# Patient Record
Sex: Female | Born: 1940 | Race: White | Hispanic: No | Marital: Married | Smoking: Never smoker
Health system: Southern US, Community
[De-identification: ages and names within clinical notes are randomized; demographics above are authoritative.]

## PROBLEM LIST (undated history)

## (undated) DIAGNOSIS — I10 Essential (primary) hypertension: Secondary | ICD-10-CM

## (undated) HISTORY — PX: ABDOMINAL HYSTERECTOMY: SHX81

## (undated) HISTORY — PX: KNEE ARTHROSCOPY: SUR90

---

## 2014-08-13 ENCOUNTER — Ambulatory Visit: Payer: Self-pay | Admitting: Family Medicine

## 2014-08-23 ENCOUNTER — Ambulatory Visit: Payer: Self-pay | Admitting: Family Medicine

## 2015-09-08 ENCOUNTER — Other Ambulatory Visit: Payer: Self-pay | Admitting: Family Medicine

## 2015-09-08 DIAGNOSIS — Z1231 Encounter for screening mammogram for malignant neoplasm of breast: Secondary | ICD-10-CM

## 2016-01-13 IMAGING — US US BREAST*L* LIMITED INC AXILLA
1 series · 4 of 4 positions shown · non-contrast
Comparison: Priors

CLINICAL DATA: Patient recalled from screening for left breast
mass.

EXAM:
DIGITAL DIAGNOSTIC  LEFT MAMMOGRAM
ULTRASOUND LEFT BREAST

[Series 1: us breast*left* limited inc axilla · 0.08mm/px · 4 of 4 slices shown]
[im 1/4]
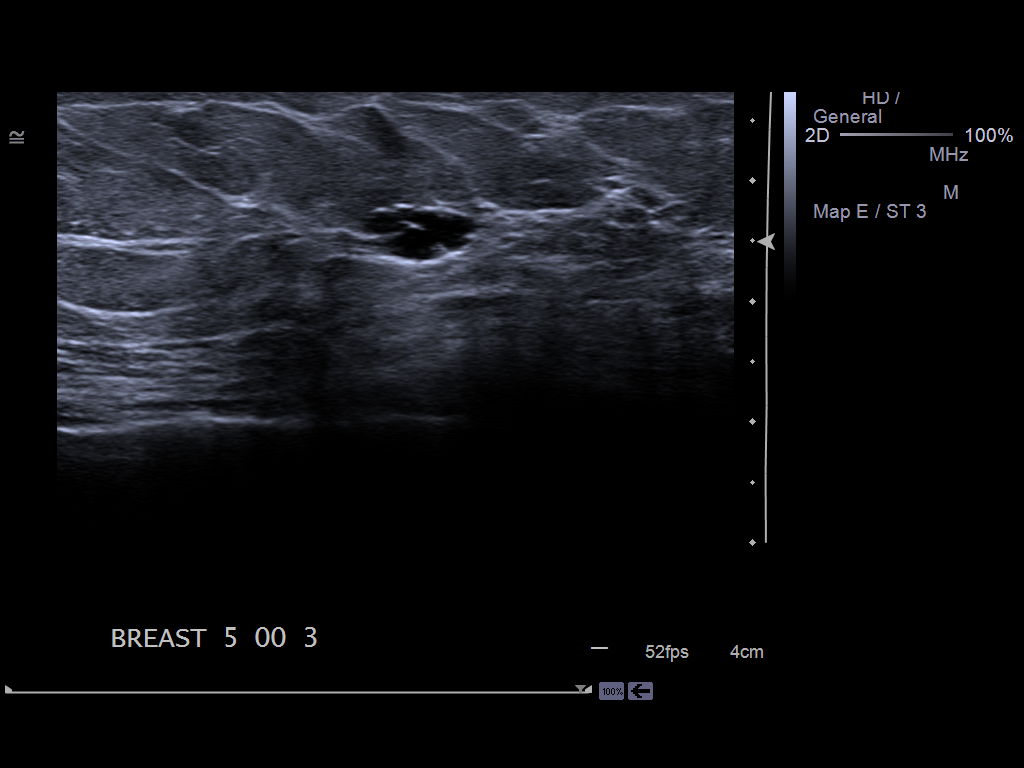
[im 2/4]
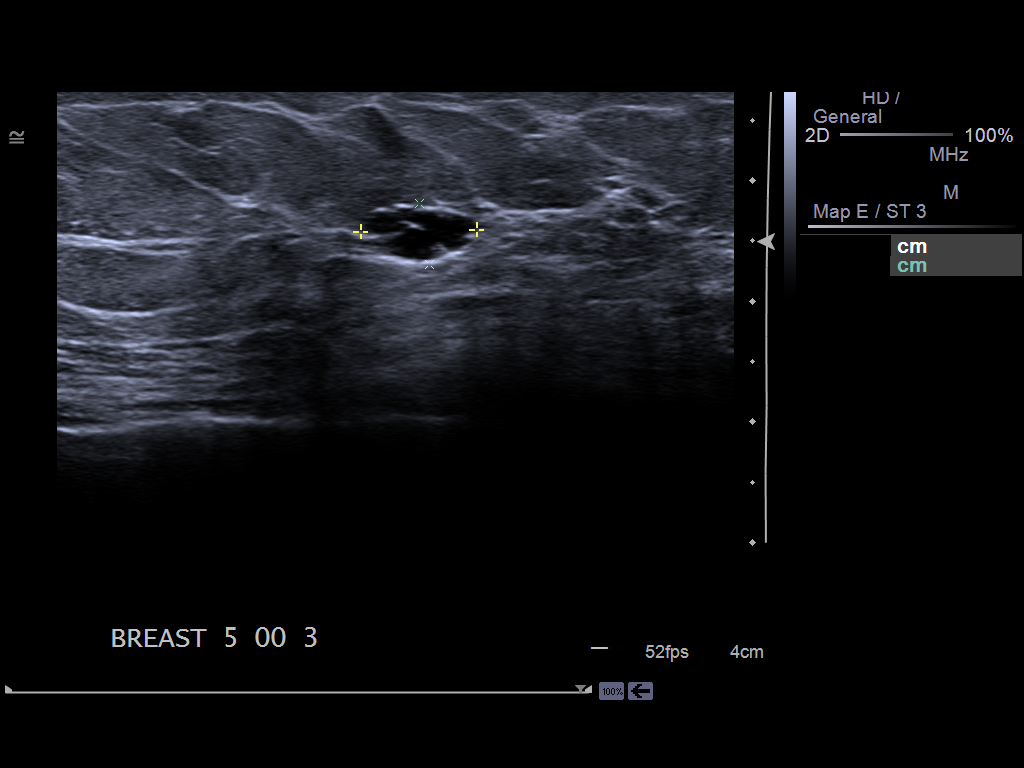
[im 3/4]
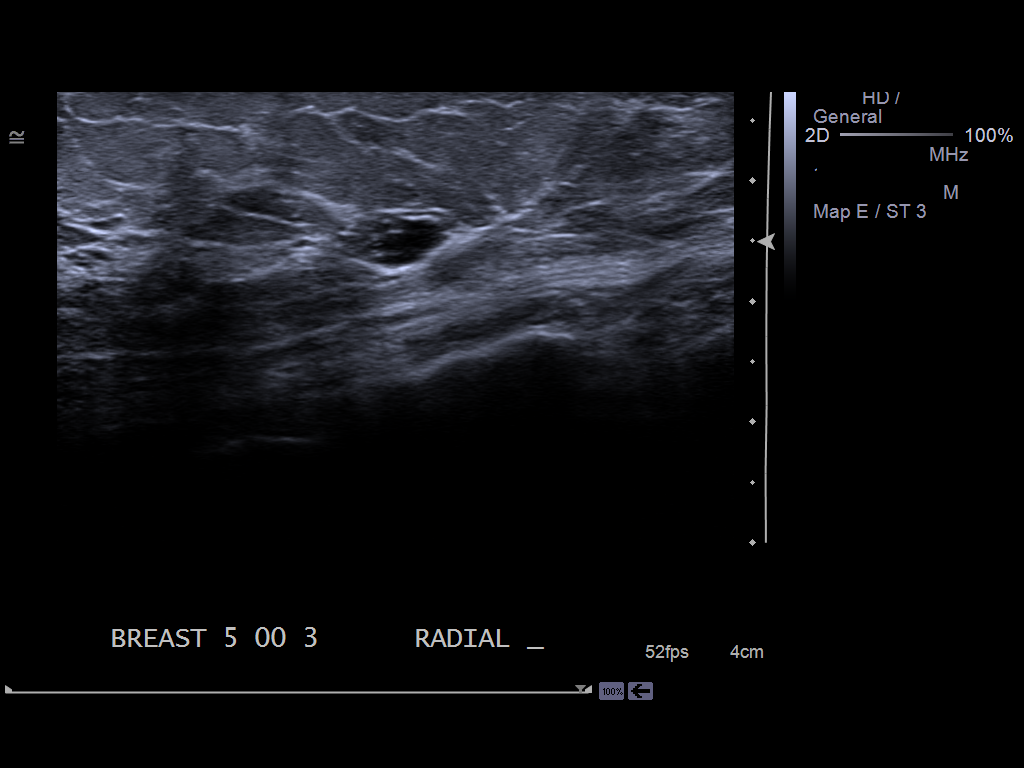
[im 4/4]
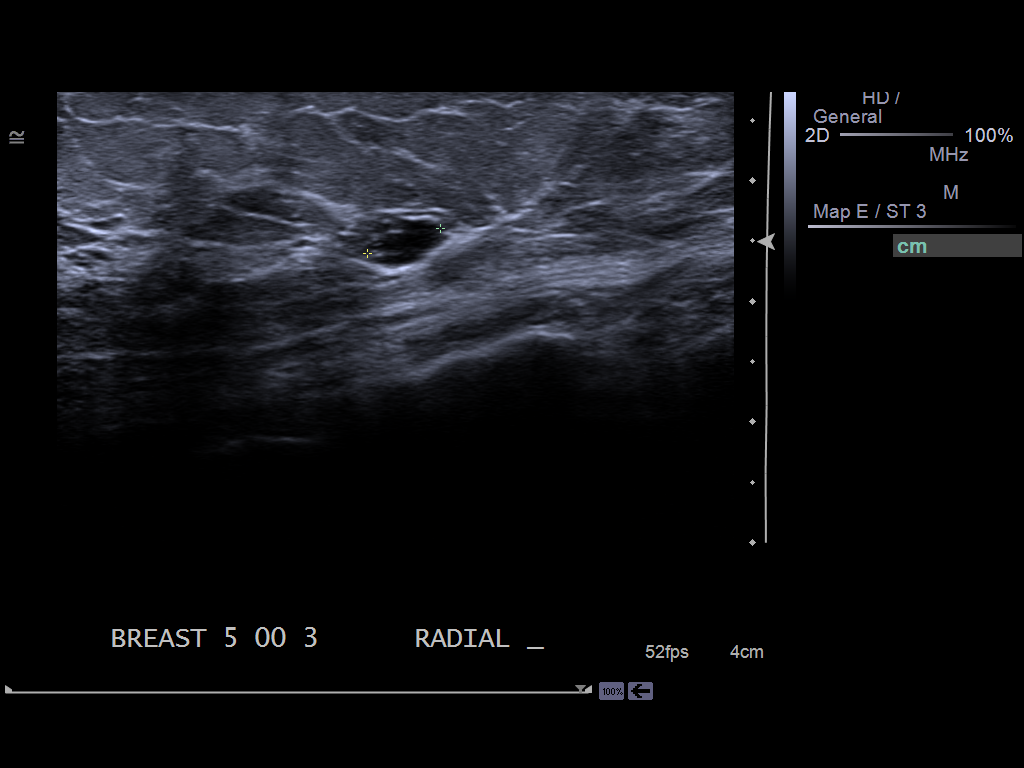

[4 of 4 positions shown; findings below may reference images not displayed]

ACR Breast Density Category b: There are scattered areas of
fibroglandular density.
FINDINGS: Within the 5 o'clock position left breast there is an obscured 9 mm
mass which persists on additional spot compression CC and MLO views.

On physical exam, I palpate no discrete mass within the 5 o'clock
position left breast.

Ultrasound is performed, showing a 10 x 5 x 6 mm cluster of cysts
within the left breast 5 o'clock position 3 cm from the nipple. This
corresponds with mammographic abnormality.
IMPRESSION: Likely benign cluster of cysts within the left breast, 5 o'clock
position.

RECOMMENDATION:
Left breast diagnostic ultrasound in 6 months to demonstrate
stability.

I have discussed the findings and recommendations with the patient.
Results were also provided in writing at the conclusion of the
visit. If applicable, a reminder letter will be sent to the patient
regarding the next appointment.

BI-RADS CATEGORY  3: Probably benign.

## 2016-09-28 ENCOUNTER — Other Ambulatory Visit: Payer: Self-pay | Admitting: Internal Medicine

## 2016-09-28 DIAGNOSIS — N63 Unspecified lump in unspecified breast: Secondary | ICD-10-CM

## 2016-10-15 ENCOUNTER — Encounter: Payer: Self-pay | Admitting: Radiology

## 2016-10-15 ENCOUNTER — Ambulatory Visit
Admission: RE | Admit: 2016-10-15 | Discharge: 2016-10-15 | Disposition: A | Discharge status: Home / Self Care | Payer: Medicare Other | Source: Ambulatory Visit | Attending: Internal Medicine | Admitting: Internal Medicine

## 2016-10-15 DIAGNOSIS — N63 Unspecified lump in unspecified breast: Secondary | ICD-10-CM | POA: Diagnosis present

## 2016-10-15 DIAGNOSIS — N6323 Unspecified lump in the left breast, lower outer quadrant: Secondary | ICD-10-CM | POA: Diagnosis not present

## 2017-02-15 ENCOUNTER — Encounter: Payer: Self-pay | Admitting: *Deleted

## 2017-02-16 ENCOUNTER — Ambulatory Visit: Payer: Medicare Other | Admitting: Anesthesiology

## 2017-02-16 ENCOUNTER — Encounter
Admission: RE | Disposition: A | Discharge status: Home / Self Care | Payer: Self-pay | Source: Ambulatory Visit | Attending: Unknown Physician Specialty

## 2017-02-16 ENCOUNTER — Ambulatory Visit
Admission: RE | Admit: 2017-02-16 | Discharge: 2017-02-16 | Disposition: A | Discharge status: Home / Self Care | Payer: Medicare Other | Source: Ambulatory Visit | Attending: Unknown Physician Specialty | Admitting: Unknown Physician Specialty

## 2017-02-16 ENCOUNTER — Encounter: Payer: Self-pay | Admitting: *Deleted

## 2017-02-16 DIAGNOSIS — K640 First degree hemorrhoids: Secondary | ICD-10-CM | POA: Insufficient documentation

## 2017-02-16 DIAGNOSIS — Z79899 Other long term (current) drug therapy: Secondary | ICD-10-CM | POA: Diagnosis not present

## 2017-02-16 DIAGNOSIS — Z1211 Encounter for screening for malignant neoplasm of colon: Secondary | ICD-10-CM | POA: Diagnosis not present

## 2017-02-16 DIAGNOSIS — I10 Essential (primary) hypertension: Secondary | ICD-10-CM | POA: Diagnosis not present

## 2017-02-16 DIAGNOSIS — K5730 Diverticulosis of large intestine without perforation or abscess without bleeding: Secondary | ICD-10-CM | POA: Insufficient documentation

## 2017-02-16 HISTORY — PX: COLONOSCOPY WITH PROPOFOL: SHX5780

## 2017-02-16 HISTORY — DX: Essential (primary) hypertension: I10

## 2017-02-16 SURGERY — COLONOSCOPY WITH PROPOFOL
Anesthesia: General

## 2017-02-16 MED ORDER — LIDOCAINE 2% (20 MG/ML) 5 ML SYRINGE
INTRAMUSCULAR | Status: DC | PRN
  Administered 2017-02-16: 40 mg via INTRAVENOUS

## 2017-02-16 MED ORDER — SODIUM CHLORIDE 0.9 % IV SOLN: INTRAVENOUS | Status: DC

## 2017-02-16 MED ORDER — PROPOFOL 500 MG/50ML IV EMUL
INTRAVENOUS | Status: DC | PRN
  Administered 2017-02-16: 180 ug/kg/min via INTRAVENOUS

## 2017-02-16 MED ORDER — PROPOFOL 500 MG/50ML IV EMUL
INTRAVENOUS | Status: AC
  Filled 2017-02-16: qty 50

## 2017-02-16 MED ORDER — PROPOFOL 10 MG/ML IV BOLUS
INTRAVENOUS | Status: DC | PRN
  Administered 2017-02-16: 100 mg via INTRAVENOUS

## 2017-02-16 MED ORDER — SODIUM CHLORIDE 0.9 % IV SOLN
INTRAVENOUS | Status: DC
  Administered 2017-02-16 (×2): via INTRAVENOUS

## 2017-02-16 MED ORDER — FENTANYL CITRATE (PF) 100 MCG/2ML IJ SOLN
INTRAMUSCULAR | Status: AC
  Filled 2017-02-16: qty 2

## 2017-02-16 NOTE — H&P (Signed)
   Primary Care Physician:  Danella PentonMiller, Mark F, MD Primary Gastroenterologist:  Dr. Mechele CollinElliott  Pre-Procedure History & Physical: HPI:  Debra Oconnor is a 76 y.o. female is here for an colonoscopy.   Past Medical History:  Diagnosis Date  . Hypertension     Past Surgical History:  Procedure Laterality Date  . ABDOMINAL HYSTERECTOMY    . KNEE ARTHROSCOPY      Prior to Admission medications   Medication Sig Start Date End Date Taking? Authorizing Provider  calcium acetate (PHOSLO) 667 MG capsule Take by mouth 3 (three) times daily with meals.   Yes [provider]  ibuprofen (ADVIL,MOTRIN) 100 MG/5ML suspension Take 200 mg by mouth every 4 (four) hours as needed.   Yes [provider]  multivitamin-iron-minerals-folic acid (CENTRUM) chewable tablet Chew 1 tablet by mouth daily.   Yes [provider]  valsartan-hydrochlorothiazide (DIOVAN-HCT) 160-12.5 MG tablet Take 1 tablet by mouth daily.   Yes [provider]    Allergies as of 12/07/2016  . (Not on File)    Family History  Problem Relation Age of Onset  . Breast cancer Mother 7063    Social History   Social History  . Marital status: Married    Spouse name: N/A  . Number of children: N/A  . Years of education: N/A   Occupational History  . Not on file.   Social History Main Topics  . Smoking status: Never Smoker  . Smokeless tobacco: Never Used  . Alcohol use No  . Drug use: No  . Sexual activity: Not on file   Other Topics Concern  . Not on file   Social History Narrative  . No narrative on file    Review of Systems: See HPI, otherwise negative ROS  Physical Exam: BP (!) 155/80   Pulse 98   Temp 98.1 F (36.7 C) (Tympanic)   Resp 16   Ht 5\' 7"  (1.702 m)   Wt 86.2 kg (190 lb)   LMP  (LMP Unknown)   SpO2 99%   BMI 29.76 kg/m  General:   Alert,  pleasant and cooperative in NAD Head:  Normocephalic and atraumatic. Neck:  Supple; no masses or  thyromegaly. Lungs:  Clear throughout to auscultation.    Heart:  Regular rate and rhythm. Abdomen:  Soft, nontender and nondistended. Normal bowel sounds, without guarding, and without rebound.   Neurologic:  Alert and  oriented x4;  grossly normal neurologically.  Impression/Plan: Debra Oconnor is here for an colonoscopy to be performed for screening colonoscopy  Risks, benefits, limitations, and alternatives regarding  colonoscopy have been reviewed with the patient.  Questions have been answered.  All parties agreeable.   Lynnae PrudeELLIOTT, ROBERT, MD  02/16/2017, 2:36 PM

## 2017-02-16 NOTE — Op Note (Signed)
[facility] Gastroenterology Patient Name: Debra Oconnor Procedure Date: 02/16/2017 2:44 PM MRN: [id number] Account #: [id number] Date of Birth: Jan 05, 1941 Admit Type: Outpatient Age: 76 Room: [facility] ENDO ROOM 3 Gender: Female Note Status: Finalized Procedure:            Colonoscopy Indications:          Screening for colorectal malignant neoplasm Providers:            Scot Jun, MD Referring MD:         Danella Penton, MD (Referring MD) Medicines:            Propofol per Anesthesia Complications:        No immediate complications. Procedure:            Pre-Anesthesia Assessment:                       - After reviewing the risks and benefits, the patient                        was deemed in satisfactory condition to undergo the                        procedure.                       After obtaining informed consent, the colonoscope was                        passed under direct vision. Throughout the procedure,                        the patient's blood pressure, pulse, and oxygen                        saturations were monitored continuously. The                        Colonoscope was introduced through the anus and                        advanced to the the cecum, identified by appendiceal                        orifice and ileocecal valve. The colonoscopy was                        performed without difficulty. The patient tolerated the                        procedure well. The quality of the bowel preparation                        was excellent. Findings:      Multiple small-medium mouthed diverticula were found in the sigmoid       colon.      Internal hemorrhoids were found during endoscopy. The hemorrhoids were       small, medium-sized and Grade I (internal hemorrhoids that do not       prolapse).      The exam was otherwise without abnormality. Impression:           -  Diverticulosis in the sigmoid colon.                       - Internal  hemorrhoids.                       - The examination was otherwise normal.                       - No specimens collected. Recommendation:       - The findings and recommendations were discussed with                        the patient's family. No Scot Junobert T Elliott, MD 02/16/2017 3:06:05 PM This report has been signed electronically. Number of Addenda: 0 Note Initiated On: 02/16/2017 2:44 PM Scope Withdrawal Time: 0 hours 7 minutes 52 seconds  Total Procedure Duration: 0 hours 13 minutes 18 seconds       [facility]

## 2017-02-16 NOTE — Anesthesia Post-op Follow-up Note (Cosign Needed)
Anesthesia QCDR form completed.        

## 2017-02-16 NOTE — Transfer of Care (Signed)
Immediate Anesthesia Transfer of Care Note  Patient: Debra Oconnor  Procedure(s) Performed: Procedure(s): COLONOSCOPY WITH PROPOFOL (N/A)  Patient Location: PACU and Endoscopy Unit  Anesthesia Type:General  Level of Consciousness: sedated  Airway & Oxygen Therapy: Patient Spontanous Breathing and Patient connected to nasal cannula oxygen  Post-op Assessment: Report given to RN and Post -op Vital signs reviewed and stable  Post vital signs: Reviewed and stable  Last Vitals:  Vitals:   02/16/17 1310 02/16/17 1510  BP: (!) 155/80   Pulse: 98   Resp: 16   Temp: 36.7 C (P) 36.9 C    Last Pain:  Vitals:   02/16/17 1510  TempSrc: (P) Tympanic         Complications: No apparent anesthesia complications

## 2017-02-16 NOTE — Anesthesia Preprocedure Evaluation (Signed)
Anesthesia Evaluation  Patient identified by MRN, date of birth, ID band Patient awake    Airway Mallampati: III       Dental  (+) Teeth Intact   Pulmonary neg pulmonary ROS,    breath sounds clear to auscultation       Cardiovascular Exercise Tolerance: Good hypertension, Pt. on medications  Rhythm:Regular     Neuro/Psych negative neurological ROS  negative psych ROS   GI/Hepatic negative GI ROS, Neg liver ROS,   Endo/Other  negative endocrine ROS  Renal/GU negative Renal ROS     Musculoskeletal negative musculoskeletal ROS (+)   Abdominal Normal abdominal exam  (+)   Peds negative pediatric ROS (+)  Hematology negative hematology ROS (+)   Anesthesia Other Findings   Reproductive/Obstetrics                             Anesthesia Physical Anesthesia Plan  ASA: II  Anesthesia Plan: General   Post-op Pain Management:    Induction: Intravenous  PONV Risk Score and Plan: 0 and Ondansetron  Airway Management Planned: Natural Airway and Nasal Cannula  Additional Equipment:   Intra-op Plan:   Post-operative Plan:   Informed Consent: I have reviewed the patients History and Physical, chart, labs and discussed the procedure including the risks, benefits and alternatives for the proposed anesthesia with the patient or authorized representative who has indicated his/her understanding and acceptance.     Plan Discussed with: CRNA  Anesthesia Plan Comments:         Anesthesia Quick Evaluation

## 2017-02-16 NOTE — Anesthesia Postprocedure Evaluation (Signed)
Anesthesia Post Note  Patient: Debra Oconnor  Procedure(s) Performed: Procedure(s) (LRB): COLONOSCOPY WITH PROPOFOL (N/A)  Patient location during evaluation: Endoscopy Anesthesia Type: General Level of consciousness: awake and alert Pain management: pain level controlled Vital Signs Assessment: post-procedure vital signs reviewed and stable Respiratory status: spontaneous breathing and respiratory function stable Cardiovascular status: stable Anesthetic complications: no     Last Vitals:  Vitals:   02/16/17 1520 02/16/17 1530  BP: (!) 124/59 (!) 141/78  Pulse:    Resp:    Temp:      Last Pain:  Vitals:   02/16/17 1510  TempSrc: Tympanic                 KEPHART,WILLIAM K

## 2017-02-17 ENCOUNTER — Encounter: Payer: Self-pay | Admitting: Unknown Physician Specialty

## 2019-01-27 IMAGING — US US BREAST*L* LIMITED INC AXILLA
1 series · 13 of 16 positions shown · non-contrast
Comparison: Previous exam(s).

CLINICAL DATA: Delayed follow-up of probably benign findings in the
left breast and annual examination of both breasts. The patient is
asymptomatic.

EXAM:
2D DIGITAL DIAGNOSTIC BILATERAL MAMMOGRAM WITH CAD AND ADJUNCT TOMO
ULTRASOUND LEFT BREAST

[Series 1: us breast*left* limited inc axilla · 0.05mm/px · 13 of 16 slices shown]
[im 1/16]
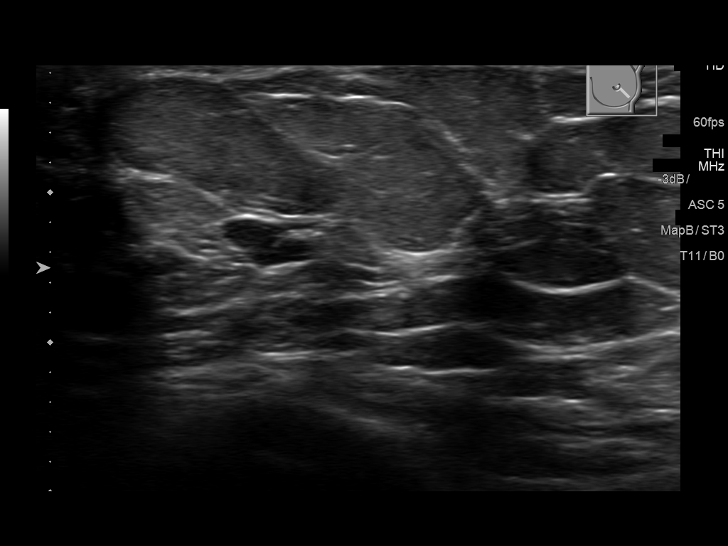
[im 2/16]
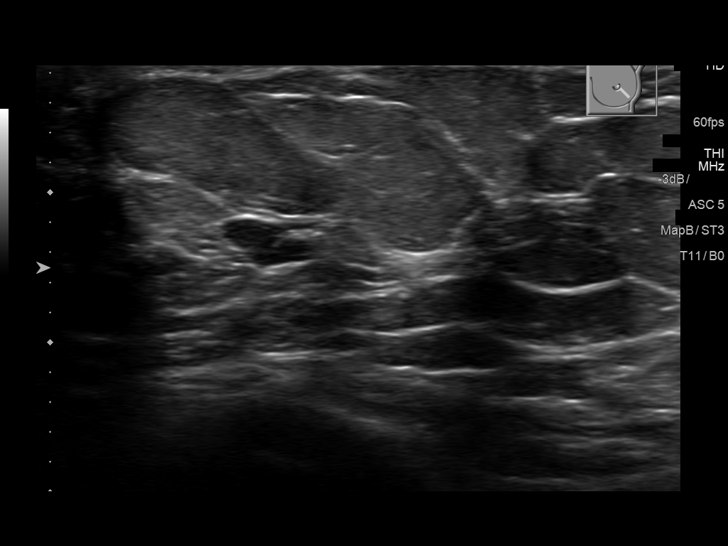
[im 4/16]
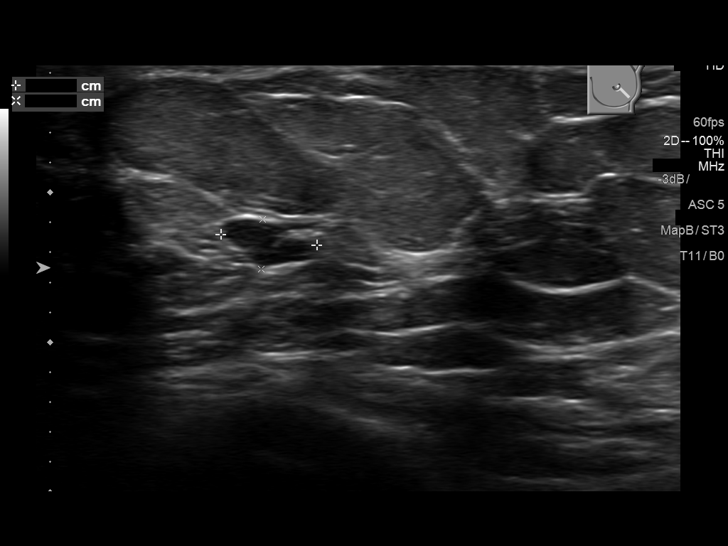
[im 5/16]
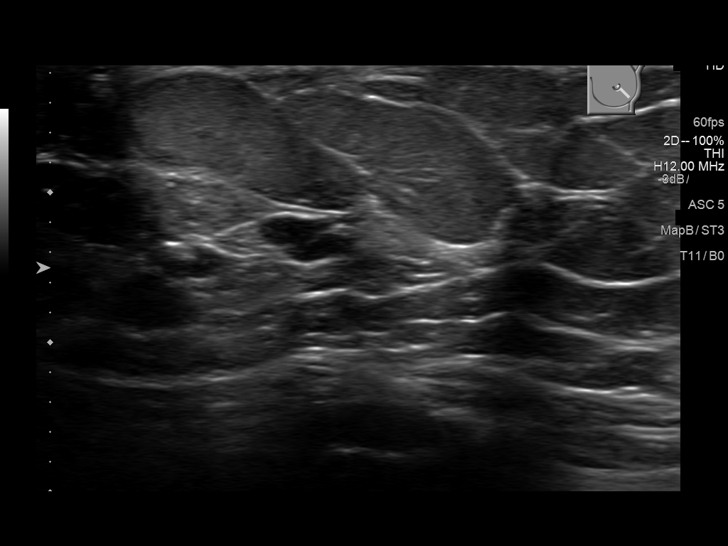
[im 6/16]
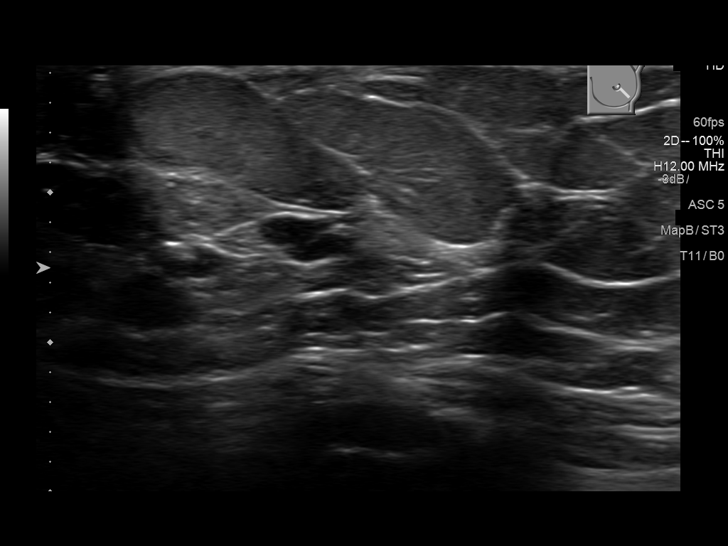
[im 7/16]
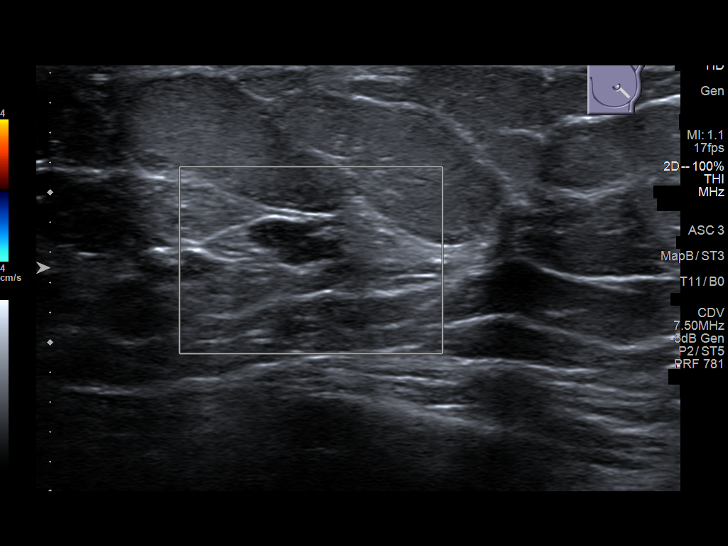
[im 9/16]
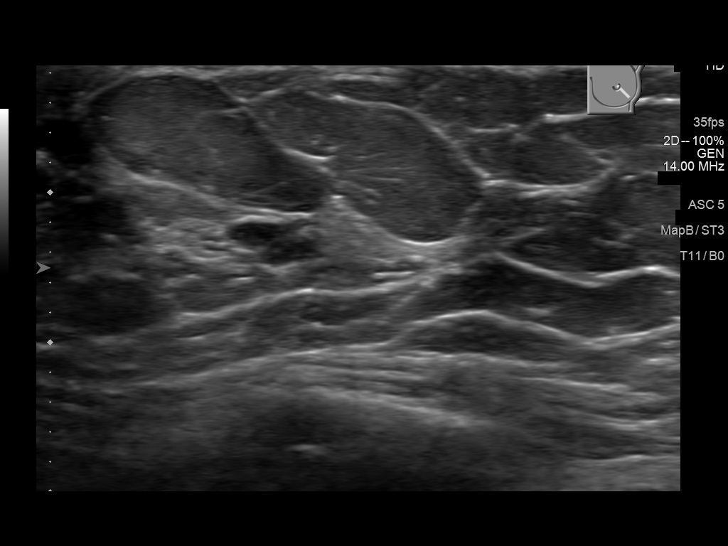
[im 10/16]
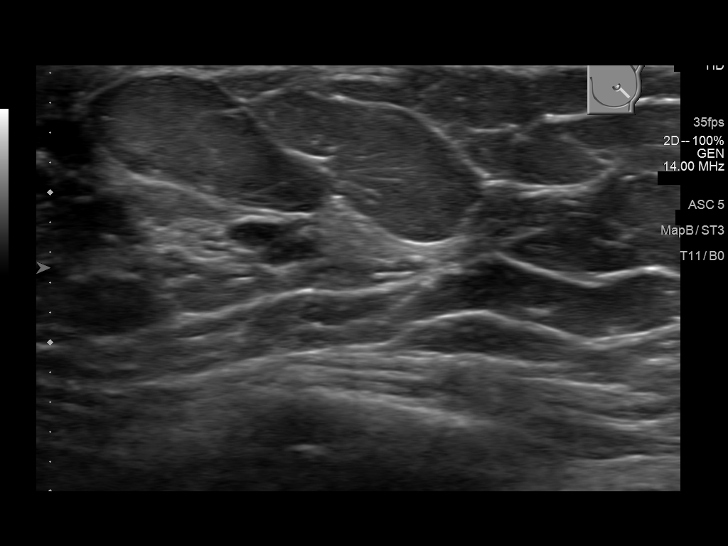
[im 11/16]
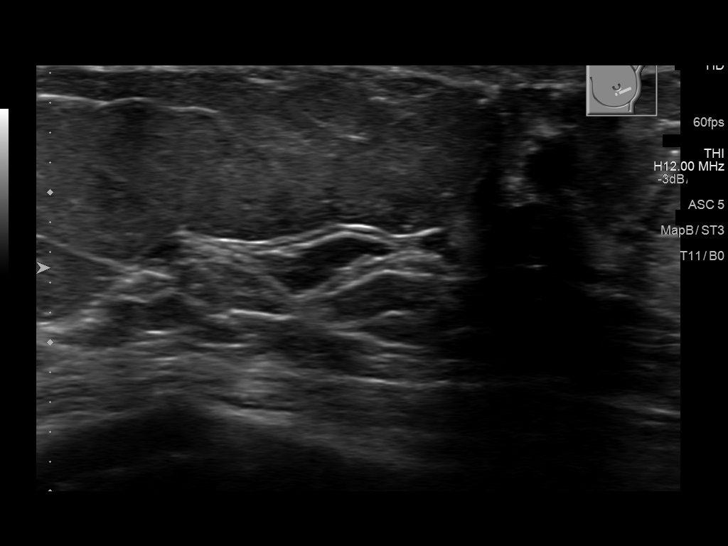
[im 12/16]
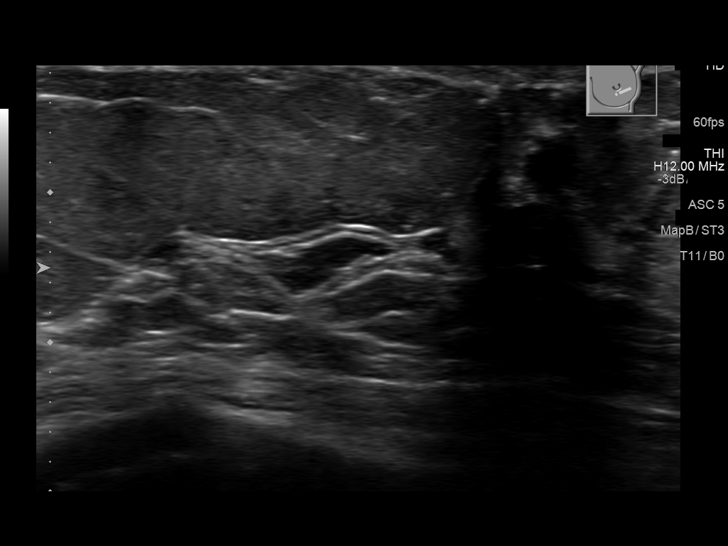
[im 13/16]
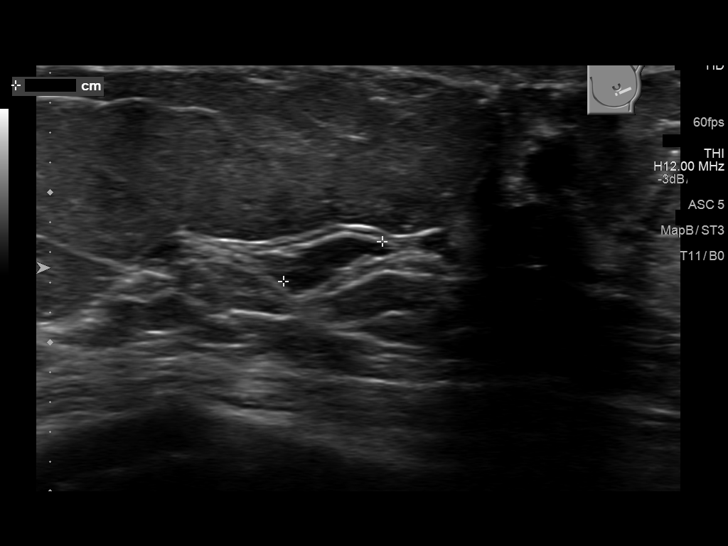
[im 15/16]
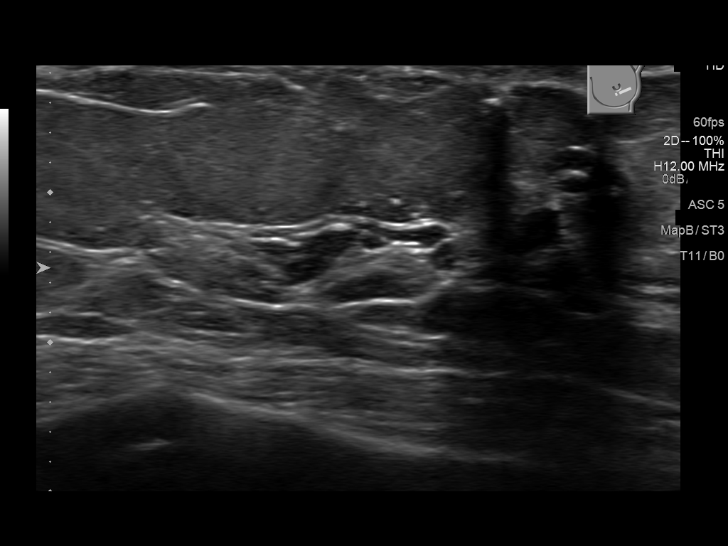
[im 16/16]
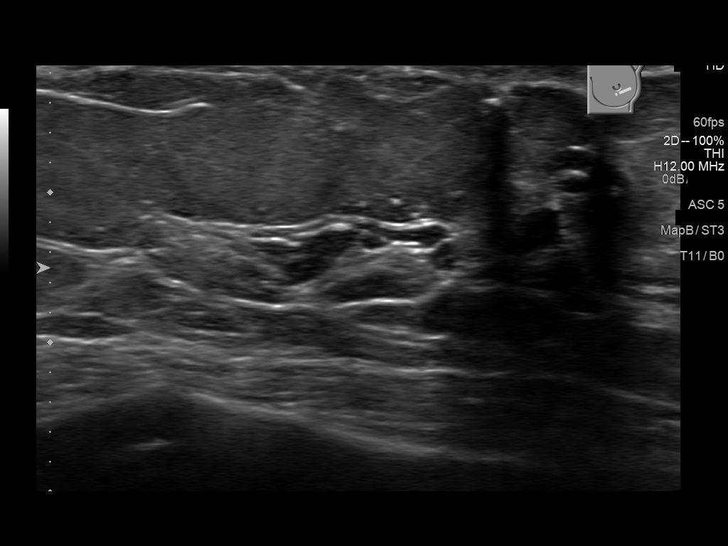

[13 of 16 positions shown; findings below may reference images not displayed]

ACR Breast Density Category c: The breast tissue is heterogeneously
dense, which may obscure small masses.
FINDINGS: No new or suspicious mass is identified in either breast. The
previously described 0.9 cm mass in the 5 o'clock position of the
left breast is stable to smaller compared to the prior mammogram August 2014. No architectural distortion or suspicious
microcalcification is identified in either breast.

Mammographic images were processed with CAD.

Targeted ultrasound is performed, showing a circumscribed oval
nearly anechoic mass with thin internal septations at 5 o'clock
position 3 cm from the nipple, measuring 0.6 x 0.3 x 0.7 cm. This
measures slightly smaller than it did on the ultrasound August 2014 at which time it was 1.0 x 0.5 x 0.6 cm.
IMPRESSION: Slight interval decrease in size of the cluster of cysts in the 5
o'clock position of the left breast. No evidence of malignancy in
either breast.

RECOMMENDATION:
Screening mammogram in one year.(Code:U8-F-UHC)

I have discussed the findings and recommendations with the patient.
Results were also provided in writing at the conclusion of the
visit. If applicable, a reminder letter will be sent to the patient
regarding the next appointment.

BI-RADS CATEGORY  2: Benign.

## 2024-07-10 ENCOUNTER — Ambulatory Visit
Admission: RE | Admit: 2024-07-10 | Discharge: 2024-07-10 | Disposition: A | Discharge status: Home / Self Care | Source: Ambulatory Visit | Attending: Orthopedic Surgery | Admitting: Orthopedic Surgery

## 2024-07-10 ENCOUNTER — Other Ambulatory Visit: Payer: Self-pay | Admitting: Orthopedic Surgery

## 2024-07-10 DIAGNOSIS — M25552 Pain in left hip: Secondary | ICD-10-CM

## 2024-07-10 DIAGNOSIS — M1612 Unilateral primary osteoarthritis, left hip: Secondary | ICD-10-CM

## 2024-09-04 ENCOUNTER — Other Ambulatory Visit: Payer: Self-pay | Admitting: Family Medicine

## 2024-09-04 ENCOUNTER — Inpatient Hospital Stay
Admission: RE | Admit: 2024-09-04 | Discharge: 2024-09-04 | Disposition: A | Payer: Self-pay | Source: Ambulatory Visit | Attending: Neurosurgery | Admitting: Neurosurgery

## 2024-09-04 DIAGNOSIS — Z049 Encounter for examination and observation for unspecified reason: Secondary | ICD-10-CM

## 2024-09-12 NOTE — Telephone Encounter (Signed)
 Patient's spouse called the office- made aware of appointment change

## 2024-09-14 NOTE — Progress Notes (Unsigned)
 "   Referring Physician:  Verlinda Boas, PA-C [address] [facility] [place],  [place] [address]  Primary Physician:  Cleotilde Oneil FALCON, MD  History of Present Illness: 09/14/2024 Ms. Shalin Linders is here today with a chief complaint of ***  lower back and left-sided sciatica down left leg causing burning    Duration: ***3 months  Conservative measures:  Physical therapy: *** ? No PT notes Multimodal medical therapy including regular antiinflammatories: Ibuprofen, prednisone taper, gabapentin, meloxicam, and tramadol Injections: 08/17/2024 Left L4-5 TESI  Past Surgery: ***  Gerardo Territo has ***no symptoms of cervical myelopathy.  The symptoms are causing a significant impact on the patient's life.   I have utilized the care everywhere function in epic to review the outside records available from external health systems.  Review of Systems:  A 10 point review of systems is negative, except for the pertinent positives and negatives detailed in the HPI.  Past Medical History: Past Medical History:  Diagnosis Date   Hypertension     Past Surgical History: Past Surgical History:  Procedure Laterality Date   ABDOMINAL HYSTERECTOMY     COLONOSCOPY WITH PROPOFOL  N/A 02/16/2017   Procedure: COLONOSCOPY WITH PROPOFOL ;  Surgeon: Viktoria Lamar DASEN, MD;  Location: [facility] ENDOSCOPY;  Service: Endoscopy;  Laterality: N/A;   KNEE ARTHROSCOPY      Allergies: Allergies as of 09/19/2024 - Review Complete 02/16/2017  Allergen Reaction Noted   Elavil [amitriptyline hcl]  02/15/2017    Medications: Current Medications[1]  Social History: Social History[2]  Family Medical History: Family History  Problem Relation Age of Onset   Breast cancer Mother 7    Physical Examination: There were no vitals filed for this visit.  General: Patient is in no apparent distress. Attention to examination is appropriate.  Neck:   Supple.  Full range of  motion.  Respiratory: Patient is breathing without any difficulty.   NEUROLOGICAL:     Awake, alert, oriented to person, place, and time.  Speech is clear and fluent.   Cranial Nerves: Pupils equal round and reactive to light.  Facial tone is symmetric.  Facial sensation is symmetric. Shoulder shrug is symmetric. Tongue protrusion is midline.    Strength: Side Biceps Triceps Deltoid Interossei Grip Wrist Ext. Wrist Flex.  R 5 5 5 5 5 5 5   L 5 5 5 5 5 5 5    Side Iliopsoas Quads Hamstring PF DF EHL  R 5 5 5 5 5 5   L 5 5 5 5 5 5    Reflexes are ***2+ and symmetric at the biceps, triceps, brachioradialis, patella and achilles.   Hoffman's is absent. Clonus is absent  Bilateral upper and lower extremity sensation is intact to light touch ***.     No evidence of dysmetria noted.  Gait is normal.    Imaging: *** I have personally reviewed the images and agree with the above interpretation.  Medical Decision Making/Assessment and Plan: Ms. Wachtel is a pleasant 84 y.o. female with ***  There are no diagnoses linked to this encounter.   Thank you for involving me in the care of this patient.    Penne MICAEL Sharps MD/MSCR Neurosurgery     [1]  Current Outpatient Medications:    calcium acetate (PHOSLO) 667 MG capsule, Take by mouth 3 (three) times daily with meals., Disp: , Rfl:    ibuprofen (ADVIL,MOTRIN) 100 MG/5ML suspension, Take 200 mg by mouth every 4 (four) hours as needed., Disp: , Rfl:  multivitamin-iron-minerals-folic acid (CENTRUM) chewable tablet, Chew 1 tablet by mouth daily., Disp: , Rfl:    valsartan-hydrochlorothiazide (DIOVAN-HCT) 160-12.5 MG tablet, Take 1 tablet by mouth daily., Disp: , Rfl:  [2]  Social History Tobacco Use   Smoking status: Never   Smokeless tobacco: Never  Substance Use Topics   Alcohol use: No   Drug use: No   "

## 2024-09-19 ENCOUNTER — Ambulatory Visit: Admitting: Neurosurgery
